# Patient Record
Sex: Female | Born: 2005 | Race: Black or African American | Hispanic: No | Marital: Single | Smoking: Never smoker
Health system: Southern US, Community
[De-identification: ages and names within clinical notes are randomized; demographics above are authoritative.]

---
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
## 2024-02-25 DIAGNOSIS — Z419 Encounter for procedure for purposes other than remedying health state, unspecified: Secondary | ICD-10-CM | POA: Diagnosis not present

## 2024-03-26 DIAGNOSIS — Z419 Encounter for procedure for purposes other than remedying health state, unspecified: Secondary | ICD-10-CM | POA: Diagnosis not present
